# Patient Record
Sex: Male | Born: 1985 | Race: White | Hispanic: No | Marital: Married | Smoking: Current every day smoker
Health system: Southern US, Community
[De-identification: ages and names within clinical notes are randomized; demographics above are authoritative.]

## PROBLEM LIST (undated history)

## (undated) DIAGNOSIS — L732 Hidradenitis suppurativa: Secondary | ICD-10-CM

## (undated) DIAGNOSIS — F329 Major depressive disorder, single episode, unspecified: Secondary | ICD-10-CM

## (undated) DIAGNOSIS — F32A Depression, unspecified: Secondary | ICD-10-CM

---
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
## 2011-06-13 ENCOUNTER — Emergency Department (INDEPENDENT_AMBULATORY_CARE_PROVIDER_SITE_OTHER): Payer: 59

## 2011-06-13 ENCOUNTER — Emergency Department (HOSPITAL_BASED_OUTPATIENT_CLINIC_OR_DEPARTMENT_OTHER)
Admission: EM | Admit: 2011-06-13 | Discharge: 2011-06-13 | Disposition: A | Discharge status: Home / Self Care | Payer: 59 | Attending: Emergency Medicine | Admitting: Emergency Medicine

## 2011-06-13 ENCOUNTER — Encounter (HOSPITAL_BASED_OUTPATIENT_CLINIC_OR_DEPARTMENT_OTHER): Payer: Self-pay

## 2011-06-13 ENCOUNTER — Other Ambulatory Visit: Payer: Self-pay

## 2011-06-13 DIAGNOSIS — R51 Headache: Secondary | ICD-10-CM

## 2011-06-13 DIAGNOSIS — S0083XA Contusion of other part of head, initial encounter: Secondary | ICD-10-CM

## 2011-06-13 DIAGNOSIS — W19XXXA Unspecified fall, initial encounter: Secondary | ICD-10-CM | POA: Insufficient documentation

## 2011-06-13 DIAGNOSIS — E860 Dehydration: Secondary | ICD-10-CM | POA: Insufficient documentation

## 2011-06-13 DIAGNOSIS — S0003XA Contusion of scalp, initial encounter: Secondary | ICD-10-CM | POA: Insufficient documentation

## 2011-06-13 DIAGNOSIS — R197 Diarrhea, unspecified: Secondary | ICD-10-CM | POA: Insufficient documentation

## 2011-06-13 DIAGNOSIS — Y9289 Other specified places as the place of occurrence of the external cause: Secondary | ICD-10-CM | POA: Insufficient documentation

## 2011-06-13 DIAGNOSIS — S1093XA Contusion of unspecified part of neck, initial encounter: Secondary | ICD-10-CM | POA: Insufficient documentation

## 2011-06-13 DIAGNOSIS — R209 Unspecified disturbances of skin sensation: Secondary | ICD-10-CM

## 2011-06-13 DIAGNOSIS — R55 Syncope and collapse: Secondary | ICD-10-CM | POA: Insufficient documentation

## 2011-06-13 DIAGNOSIS — R1110 Vomiting, unspecified: Secondary | ICD-10-CM | POA: Insufficient documentation

## 2011-06-13 LAB — BASIC METABOLIC PANEL
Calcium: 9.5 mg/dL (ref 8.4–10.5)
GFR calc non Af Amer: >90 mL/min (ref >90)
Sodium: 139 mEq/L (ref 135–145)

## 2011-06-13 LAB — URINALYSIS, ROUTINE W REFLEX MICROSCOPIC
Ketones, ur: NEGATIVE mg/dL
Leukocytes, UA: NEGATIVE
Protein, ur: NEGATIVE mg/dL
Urobilinogen, UA: 1 mg/dL (ref 0.0–1.0)

## 2011-06-13 LAB — CBC
MCH: 30.8 pg (ref 26.0–34.0)
MCHC: 35.9 g/dL (ref 30.0–36.0)
Platelets: 344 10*3/uL (ref 150–400)
RDW: 12.9 % (ref 11.5–15.5)

## 2011-06-13 LAB — CARDIAC PANEL(CRET KIN+CKTOT+MB+TROPI): Total CK: 68 U/L (ref 7–232)

## 2011-06-13 MED ORDER — ACETAMINOPHEN 325 MG PO TABS
650.0000 mg | ORAL_TABLET | Freq: Once | ORAL | Status: AC
  Administered 2011-06-13: 650 mg via ORAL
  Filled 2011-06-13: qty 2

## 2011-06-13 MED ORDER — SODIUM CHLORIDE 0.9 % IV BOLUS (SEPSIS)
1000.0000 mL | Freq: Once | INTRAVENOUS | Status: AC
  Administered 2011-06-13: 1000 mL via INTRAVENOUS

## 2011-06-13 MED ORDER — ONDANSETRON HCL 4 MG PO TABS: 4.0000 mg | ORAL_TABLET | Freq: Four times a day (QID) | ORAL | Status: AC

## 2011-06-13 MED ORDER — IBUPROFEN 800 MG PO TABS: 800.0000 mg | ORAL_TABLET | Freq: Three times a day (TID) | ORAL | Status: AC

## 2011-06-13 NOTE — ED Notes (Signed)
Pt sts has been sick with stomach virus,started feeling sick again,went to work and pass out falling on side of left face, noted slight swelling under eye.

## 2011-06-13 NOTE — ED Provider Notes (Addendum)
History     CSN: [id number]  Arrival date & time 06/13/11  3086   None     Chief Complaint  Patient presents with  . Loss of Consciousness   26 year old male with no significant past medical history states has been sick with a "stomach virus" over the past 6-7, days. He's had nausea, vomiting, and watery diarrhea. He felt that he was getting better over the weekend, and, states he felt better yesterday. Today he went to work and became dizzy and fell over and did strike his face on the concrete. He apparently has a history of a mandible fracture from years ago. He has had no chest pain or palpitations. He denies any abdominal pain or back pain. He denies any headache or any focal weakness. No numbness, weakness or tingling. Last emesis was one to 2 days ago. Last diarrhea was yesterday and in described as watery. No recent travel or antibiotics. No new medications. No drugs, alcohol, or tobacco. His father states that he does take care of his own children on the weekends. However, his children have not been recently sick. Presently, he denies any pain. He still feels somewhat dizzy. No other symptoms at this time  (Consider location/radiation/quality/duration/timing/severity/associated sxs/prior treatment) HPI  History reviewed. No pertinent past medical history.  Past Surgical History  Procedure Date  . Mandible surgery 2005    History reviewed. No pertinent family history.  History  Substance Use Topics  . Smoking status: Not on file  . Smokeless tobacco: Not on file  . Alcohol Use: No      Review of Systems  All other systems reviewed and are negative.    Allergies  Review of patient's allergies indicates no known allergies.  Home Medications  No current outpatient prescriptions on file.  BP 131/80  Pulse 110  Temp(Src) 97.8 F (36.6 C) (Oral)  Resp 20  SpO2 97%  Physical Exam  Nursing note and vitals reviewed. Constitutional: He is oriented to person, place,  and time. He appears well-developed and well-nourished.  HENT:  Head: Normocephalic.       Mild tenderness along the bilateral mandible. There is no outward signs of contusion, redness or step-off no raccoon's eyes or battle signs.  Eyes: Conjunctivae and EOM are normal. Pupils are equal, round, and reactive to light.  Neck: Neck supple.  Cardiovascular: Normal rate and regular rhythm.  Exam reveals no gallop and no friction rub.   No murmur heard. Pulmonary/Chest: Breath sounds normal. He has no wheezes. He has no rales. He exhibits no tenderness.  Abdominal: Soft. Bowel sounds are normal. He exhibits no distension. There is no tenderness. There is no rebound and no guarding.       Abdomen is soft, nontender. Bowel sounds present appear normal  Musculoskeletal: Normal range of motion.  Neurological: He is alert and oriented to person, place, and time. No cranial nerve deficit. Coordination normal.  Skin: Skin is warm and dry. No rash noted.  Psychiatric: He has a normal mood and affect.    ED Course  Procedures (including critical care time)  Labs Reviewed - No data to display No results found.   No diagnosis found.    MDM  Pt is seen and examined;  Initial history and physical completed.  Will follow.     Will check EKG, labs and hydrate. CT scan of the maxillofacial has been ordered at the request of the family. Vital signs are stable. Likely secondary to dehydration and orthostasis.  Kathleen Tamm A. Patrica Duel, MD 06/13/11 0724   Date: 06/13/2011  Rate: 88  Rhythm: normal sinus rhythm  QRS Axis: normal  Intervals: normal  ST/T Wave abnormalities: normal  Conduction Disutrbances:none  Narrative Interpretation:   Old EKG Reviewed: none available    Results for orders placed during the hospital encounter of 06/13/11  CBC      Component Value Range   WBC 6.2  4.0 - 10.5 (K/uL)   RBC 5.22  4.22 - 5.81 (MIL/uL)   Hemoglobin 16.1  13.0 - 17.0 (g/dL)   HCT 14.7  82.9 -  56.2 (%)   MCV 86.0  78.0 - 100.0 (fL)   MCH 30.8  26.0 - 34.0 (pg)   MCHC 35.9  30.0 - 36.0 (g/dL)   RDW 13.0  86.5 - 78.4 (%)   Platelets 344  150 - 400 (K/uL)   Ct Maxillofacial Wo Cm  06/13/2011  *RADIOLOGY REPORT*  Clinical Data: Fall with facial pain and numbness.  CT MAXILLOFACIAL WITHOUT CONTRAST  Technique:  Multidetector CT imaging of the maxillofacial structures was performed. Multiplanar CT image reconstructions were also generated.  Comparison: None.  Findings: There are fracture deformities of the superior, anterior and lateral walls of the left maxillary sinus.  No corresponding air-fluid level in the left maxillary sinus.  Minimal mucosal thickening is seen inferiorly.  No additional evidence of fracture. Plate and screw fixation of the right mandible is noted.  Visualized portions of the intracranial contents show no acute findings.  Soft tissues are unremarkable.  IMPRESSION:  1. Fracture deformities of the superior, anterior and lateral walls of the left maxillary sinus appear remote, given the absence of fluid in the left maxillary sinus.  Please correlate clinically. 2.  No additional evidence of acute fracture.  Original Report Authenticated By: Reyes Ivan, M.D.    8:33 AM  Patient resting comfortably, initial labs are, reassuring. We'll continue to hydrate and follow   9:17 AM  Patient feels better. Urine showed no ketones. Electrolytes were normal. CBC was normal. CK and troponin were normal. CT of the maxillofacial showed the fractures only. No evidence of any acute fracture Discussed the medication with patient recommended Tylenol and Motrin   He would like to go home and rest, which is reasonable. He is totally dependent fluids and rest and to follow up with a primary medical doctor or to return to ED for any concerns   Theron Arista A. Patrica Duel, MD 06/13/11 [phone]

## 2011-06-13 NOTE — Discharge Instructions (Signed)
Contusion A contusion is a deep bruise. Bruises happen when an injury causes bleeding under the skin. Signs of bruising include pain, puffiness (swelling), and discolored skin. The bruise may turn blue, purple, or yellow. HOME CARE   Rest the injured area until the pain and puffiness are better.   Try to limit use of the injured area as much as possible or as told by your doctor.   Put ice on the injured area.   Put ice in a plastic bag.   Place a towel between your skin and the bag.   Leave the ice on for 15 to 20 minutes, 3 to 4 times a day.   Raise (elevate) the injured area above the level of the heart.   Use an elastic bandage to lessen puffiness and motion.   Only take medicine as told by your doctor.   Eat healthy.   See your doctor for a follow-up visit.  GET HELP RIGHT AWAY IF:   There is more redness, puffiness, or pain.   You have a headache, muscle ache, or you feel dizzy and ill.   You have a fever.   The pain is not controlled with medicine.   The bruise is not getting better.   There is yellowish white fluid (pus) coming from the wound.   You lose feeling (numbness) in the injured area.   The bruised area feels cold.   There are new problems.  MAKE SURE YOU:   Understand these instructions.   Will watch your condition.   Will get help right away if you are not doing well or get worse.  Document Released: 10/03/2007 Document Revised: 12/27/2010 Document Reviewed: 10/03/2007 [facility] Patient Information 2012 [place], [place].  Syncope You have had a fainting (syncopal) spell. A fainting episode is a sudden, short-lived loss of consciousness. It results in complete recovery. It occurs because there has been a temporary shortage of oxygen and/or sugar (glucose) to the brain. CAUSES   Blood pressure pills and other medications that may lower blood pressure below normal. Sudden changes in posture (sudden standing).   Over-medication. Take your  medications as directed.   Standing too long. This can cause blood to pool in the legs.   Seizure disorders.   Low blood sugar (hypoglycemia) of diabetes. This more commonly causes coma.   Bearing down to go to the bathroom. This can cause your blood pressure to rise suddenly. Your body compensates by making the blood pressure too low when you stop bearing down.   Hardening of the arteries where the brain temporarily does not receive enough blood.   Irregular heart beat and circulatory problems.   Fear, emotional distress, injury, sight of blood, or illness.  Your caregiver will send you home if the syncope was from non-worrisome causes (benign). Depending on your age and health, you may stay to be monitored and observed. If you return home, have someone stay with you if your caregiver feels that is desirable. It is very important to keep all follow-up referrals and appointments in order to properly manage this condition. This is a serious problem which can lead to serious illness and death if not carefully managed.  WARNING: Do not drive or operate machinery until your caregiver feels that it is safe for you to do so. SEEK IMMEDIATE MEDICAL CARE IF:   You have another fainting episode or faint while lying or sitting down. DO NOT DRIVE YOURSELF. Call 911 if no other help is available.   You  have chest pain, are feeling sick to your stomach (nausea), vomiting or abdominal pain.   You have an irregular heartbeat or one that is very fast (pulse over 120 beats per minute).   You have a loss of feeling in some part of your body or lose movement in your arms or legs.   You have difficulty with speech, confusion, severe weakness, or visual problems.   You become sweaty and/or feel light headed.  Make sure you are rechecked as instructed. Document Released: 04/16/2005 Document Revised: 12/27/2010 Document Reviewed: 12/05/2006 [facility] Patient Information 2012 [place], [place].

## 2013-04-09 ENCOUNTER — Encounter (HOSPITAL_BASED_OUTPATIENT_CLINIC_OR_DEPARTMENT_OTHER): Payer: Self-pay | Admitting: Emergency Medicine

## 2013-04-09 ENCOUNTER — Emergency Department (HOSPITAL_BASED_OUTPATIENT_CLINIC_OR_DEPARTMENT_OTHER): Payer: 59

## 2013-04-09 ENCOUNTER — Emergency Department (HOSPITAL_BASED_OUTPATIENT_CLINIC_OR_DEPARTMENT_OTHER)
Admission: EM | Admit: 2013-04-09 | Discharge: 2013-04-09 | Disposition: A | Discharge status: Home / Self Care | Payer: 59 | Attending: Emergency Medicine | Admitting: Emergency Medicine

## 2013-04-09 DIAGNOSIS — M25579 Pain in unspecified ankle and joints of unspecified foot: Secondary | ICD-10-CM | POA: Insufficient documentation

## 2013-04-09 DIAGNOSIS — F17200 Nicotine dependence, unspecified, uncomplicated: Secondary | ICD-10-CM | POA: Insufficient documentation

## 2013-04-09 DIAGNOSIS — M79671 Pain in right foot: Secondary | ICD-10-CM | POA: Diagnosis present

## 2013-04-09 NOTE — ED Provider Notes (Signed)
CSN: [id number]     Arrival date & time 04/09/13  1408 History   First MD Initiated Contact with Patient 04/09/13 1413     Chief Complaint  Patient presents with  . Foot Pain   (Consider location/radiation/quality/duration/timing/severity/associated sxs/prior Treatment) Patient is a 27 y.o. male presenting with lower extremity pain. The history is provided by the patient.  Foot Pain This is a new problem. The current episode started 1 to 2 hours ago. The problem occurs constantly. The problem has not changed since onset.Pertinent negatives include no chest pain, no abdominal pain, no headaches and no shortness of breath. The symptoms are aggravated by walking. Nothing relieves the symptoms. He has tried nothing for the symptoms. The treatment provided no relief.    History reviewed. No pertinent past medical history. Past Surgical History  Procedure Laterality Date  . Mandible surgery  2005   No family history on file. History  Substance Use Topics  . Smoking status: Current Every Day Smoker -- 5 years  . Smokeless tobacco: Not on file  . Alcohol Use: No    Review of Systems  Constitutional: Negative for fever.  HENT: Negative for drooling and rhinorrhea.   Eyes: Negative for pain.  Respiratory: Negative for cough and shortness of breath.   Cardiovascular: Negative for chest pain and leg swelling.  Gastrointestinal: Negative for nausea, vomiting, abdominal pain and diarrhea.  Genitourinary: Negative for dysuria and hematuria.  Musculoskeletal: Negative for gait problem and neck pain.  Skin: Negative for color change.  Neurological: Negative for numbness and headaches.  Hematological: Negative for adenopathy.  Psychiatric/Behavioral: Negative for behavioral problems.  All other systems reviewed and are negative.    Allergies  Review of patient's allergies indicates no known allergies.  Home Medications  No current outpatient prescriptions on file. BP 154/86  Pulse 100   Temp(Src) 97.6 F (36.4 C) (Oral)  Resp 18  Ht 5\' 9"  (1.753 m)  Wt 280 lb (127.007 kg)  BMI 41.33 kg/m2  SpO2 99% Physical Exam  Nursing note and vitals reviewed. Constitutional: He is oriented to person, place, and time. He appears well-developed and well-nourished.  HENT:  Head: Normocephalic and atraumatic.  Right Ear: External ear normal.  Left Ear: External ear normal.  Nose: Nose normal.  Mouth/Throat: Oropharynx is clear and moist. No oropharyngeal exudate.  Eyes: Conjunctivae and EOM are normal. Pupils are equal, round, and reactive to light.  Neck: Normal range of motion. Neck supple.  Cardiovascular: Normal rate, regular rhythm, normal heart sounds and intact distal pulses.  Exam reveals no gallop and no friction rub.   No murmur heard. Pulmonary/Chest: Effort normal and breath sounds normal. No respiratory distress. He has no wheezes.  Abdominal: Soft. Bowel sounds are normal. He exhibits no distension. There is no tenderness. There is no rebound and no guarding.  Musculoskeletal: Normal range of motion. He exhibits tenderness (mild tenderness to palpation of the mid right foot on the dorsal aspect. 2+ distal pulses and sensation intact diffusely. Normal range of motion of the right foot and right ankle.). He exhibits no edema.  Neurological: He is alert and oriented to person, place, and time.  Skin: Skin is warm and dry.  Psychiatric: He has a normal mood and affect. His behavior is normal.    ED Course  Procedures (including critical care time) Labs Review Labs Reviewed - No data to display Imaging Review Dg Foot Complete Right  04/09/2013   CLINICAL DATA:  Pain  EXAM: RIGHT FOOT COMPLETE -  3+ VIEW  COMPARISON:  None.  FINDINGS: Frontal, oblique, and lateral views were obtained. There is no fracture or dislocation. Joint spaces appear intact. There are minimal inferior and posterior calcaneal spurs.  IMPRESSION: Minimal calcaneal spurs.  No fracture or appreciable  arthropathy.   Electronically Signed   By: Bretta Bang M.D.   On: 04/09/2013 14:35    EKG Interpretation   None       MDM   1. Right foot pain    2:17 PM 27 y.o. male who presents with sudden onset right mid foot pain which occurred while walking approximately 2 hours ago. He denies injury. He states that he only has pain when placing weight on the foot. He is neurovascularly intact and appears well on exam. He refuses pain medicine at this time. Will get screening imaging.  2:50 PM: No injury noted on imaging. Will provide crutches. Possibly sprained. I have discussed the diagnosis/risks/treatment options with the patient and believe the pt to be eligible for discharge home to follow-up with pcp or return here in 7 days if pain continues. We also discussed returning to the ED immediately if new or worsening sx occur. We discussed the sx which are most concerning (e.g., worsening pain) that necessitate immediate return. Any new prescriptions provided to the patient are listed below.  New Prescriptions   No medications on file     Junius Argyle, MD 04/09/13 1450

## 2013-04-09 NOTE — ED Notes (Signed)
Pt states walking at work and top of right foot starting hurting. Denies injury

## 2013-04-09 NOTE — ED Notes (Signed)
Patient transported to & from X-ray. 

## 2016-05-10 ENCOUNTER — Encounter (HOSPITAL_BASED_OUTPATIENT_CLINIC_OR_DEPARTMENT_OTHER): Payer: Self-pay | Admitting: Emergency Medicine

## 2016-05-10 ENCOUNTER — Emergency Department (HOSPITAL_BASED_OUTPATIENT_CLINIC_OR_DEPARTMENT_OTHER)
Admission: EM | Admit: 2016-05-10 | Discharge: 2016-05-10 | Disposition: A | Discharge status: Home / Self Care | Payer: 59 | Attending: Emergency Medicine | Admitting: Emergency Medicine

## 2016-05-10 DIAGNOSIS — F17200 Nicotine dependence, unspecified, uncomplicated: Secondary | ICD-10-CM | POA: Insufficient documentation

## 2016-05-10 DIAGNOSIS — Z79899 Other long term (current) drug therapy: Secondary | ICD-10-CM | POA: Insufficient documentation

## 2016-05-10 DIAGNOSIS — H9202 Otalgia, left ear: Secondary | ICD-10-CM

## 2016-05-10 HISTORY — DX: Major depressive disorder, single episode, unspecified: F32.9

## 2016-05-10 HISTORY — DX: Depression, unspecified: F32.A

## 2016-05-10 NOTE — ED Triage Notes (Signed)
Pt reports intense L ear pain following eating lunch today. Pain has subsided some.

## 2016-05-10 NOTE — Discharge Instructions (Signed)
You ear looks normal today. No signs of infections at this time. This is likely issue with your jaw possibly from history of dislocation. I would continue to use tylenol and ibuprofen for pain. Use a heating pack on your left side of face. Follow up with your primary care doctor and orthopaedics. I am also giving you a referral to ENT. Return to the Ed if you develop and discharge from your ear, hearing loss, worsening pain, numbness/tingling, or fevers.

## 2016-05-11 NOTE — ED Provider Notes (Signed)
[facility] Provider Note   CSN: [id number] Arrival date & time: 05/10/16  1548     History   Chief Complaint Chief Complaint  Patient presents with  . Otalgia    HPI Christopher Hayes is a 31 y.o. male.  31 year old Caucasian male with no significant past medical history presents to the ED today with complaint of left ear pain while eating lunch. Patient took Tylenol and his pain has subsided since arriving to the ED. Patient states that he does have a history of mandible dislocation in 2005. He has been having this pain intermittently for the past 4-5 months. He denies any discharge from his ear. He denies any hearing loss. Opening and closing his mouth at times makes the pain worse. He describes the pain as sharp in nature. Denies any pain at this time. Denies any fever, chills, headache, vision changes, mastoid tenderness, rhinorrhea, sore throat, cough or any other associated symptoms.      Past Medical History:  Diagnosis Date  . Depression     Patient Active Problem List   Diagnosis Date Noted  . Right foot pain 04/09/2013    Past Surgical History:  Procedure Laterality Date  . MANDIBLE SURGERY  2005       Home Medications    Prior to Admission medications   Medication Sig Start Date End Date Taking? Authorizing Provider  buPROPion (WELLBUTRIN) 75 MG tablet Take 75 mg by mouth 2 (two) times daily.   Yes Historical Provider, MD    Family History No family history on file.  Social History Social History  Substance Use Topics  . Smoking status: Current Every Day Smoker    Years: 5.00  . Smokeless tobacco: Never Used  . Alcohol use No     Allergies   Patient has no known allergies.   Review of Systems Review of Systems  Constitutional: Negative for chills and fever.  HENT: Positive for ear pain. Negative for congestion, ear discharge, postnasal drip, rhinorrhea, sinus pain, sinus pressure and sore throat.   Eyes: Negative for  visual disturbance.  Neurological: Negative for headaches.  All other systems reviewed and are negative.    Physical Exam Updated Vital Signs BP 132/82 (BP Location: Right Arm)   Pulse 78   Temp 98.5 F (36.9 C) (Oral)   Resp 16   Wt 105.2 kg   SpO2 100%   BMI 34.26 kg/m   Physical Exam  Constitutional: He appears well-developed and well-nourished. No distress.  HENT:  Head: Normocephalic and atraumatic.  Right Ear: Tympanic membrane, external ear and ear canal normal. No tenderness. No mastoid tenderness. Tympanic membrane is not perforated.  Left Ear: Tympanic membrane, external ear and ear canal normal. No tenderness. No mastoid tenderness. Tympanic membrane is not perforated.  Nose: Nose normal.  Mouth/Throat: Uvula is midline, oropharynx is clear and moist and mucous membranes are normal.  Patient with mild pain to palpation over the left TM joint. No erythema, edema, ecchymosis. No visualized gross abscess in the mouth. No pain with palpation of the dentition. Left ear appears normal.  Eyes: Conjunctivae are normal. Pupils are equal, round, and reactive to light. Right eye exhibits no discharge. Left eye exhibits no discharge. No scleral icterus.  Neck: Normal range of motion. Neck supple.  Pulmonary/Chest: No respiratory distress.  Musculoskeletal: Normal range of motion.  Lymphadenopathy:    He has no cervical adenopathy.  Neurological: He is alert.  Skin: Skin is warm and dry. Capillary refill takes less  than 2 seconds. No pallor.  Nursing note and vitals reviewed.    ED Treatments / Results  Labs (all labs ordered are listed, but only abnormal results are displayed) Labs Reviewed - No data to display  EKG  EKG Interpretation None       Radiology No results found.  Procedures Procedures (including critical care time)  Medications Ordered in ED Medications - No data to display   Initial Impression / Assessment and Plan / ED Course  I have reviewed  the triage vital signs and the nursing notes.  Pertinent labs & imaging results that were available during my care of the patient were reviewed by me and considered in my medical decision making (see chart for details).  Clinical Course   Patient presents to the ED with left ear pain while eating lunch today. Pain has subsided prior to arrival. Patient with history of mandibular dislocation. This pain has been ongoing for the past 4-5 months intermittently. Patient states pain is worse with jaw movement. Ear is normal. No signs of perforated TM. No signs of otitis media or otitis externa. Hearing is normal. No discharge. No erythema, edema of the face. No gross abscess noted in the mouth. 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 Encourage patient to continue to use ibuprofen and Tylenol for pain. Warm compresses to the left side of the face. Encouraged to follow-up with his primary care doctor and given a referral orthopedist and ENT. Patient does not appear to be any acute distress. He was hemodynamically stable. He is afebrile and not tachycardic. Pt is hemodynamically stable, in NAD, & able to ambulate in the ED. Pain has been managed & has no complaints prior to dc. Pt is comfortable with above plan and is stable for discharge at this time. All questions were answered prior to disposition. Strict return precautions for f/u to the ED were discussed.   Final Clinical Impressions(s) / ED Diagnoses   Final diagnoses:  Otalgia of left ear    New Prescriptions Discharge Medication List as of 05/10/2016  4:29 PM       Rise MuKenneth T Leaphart, PA-C 05/11/16 0120    Jacalyn LefevreJulie Haviland, MD 05/15/16 [phone]

## 2018-09-13 ENCOUNTER — Emergency Department (HOSPITAL_BASED_OUTPATIENT_CLINIC_OR_DEPARTMENT_OTHER)
Admission: EM | Admit: 2018-09-13 | Discharge: 2018-09-13 | Disposition: A | Discharge status: Home / Self Care | Payer: 59 | Attending: Emergency Medicine | Admitting: Emergency Medicine

## 2018-09-13 ENCOUNTER — Encounter (HOSPITAL_BASED_OUTPATIENT_CLINIC_OR_DEPARTMENT_OTHER): Payer: Self-pay | Admitting: Emergency Medicine

## 2018-09-13 ENCOUNTER — Other Ambulatory Visit: Payer: Self-pay

## 2018-09-13 DIAGNOSIS — R1903 Right lower quadrant abdominal swelling, mass and lump: Secondary | ICD-10-CM | POA: Diagnosis present

## 2018-09-13 DIAGNOSIS — L02211 Cutaneous abscess of abdominal wall: Secondary | ICD-10-CM | POA: Diagnosis not present

## 2018-09-13 DIAGNOSIS — F17210 Nicotine dependence, cigarettes, uncomplicated: Secondary | ICD-10-CM | POA: Insufficient documentation

## 2018-09-13 DIAGNOSIS — L0291 Cutaneous abscess, unspecified: Secondary | ICD-10-CM

## 2018-09-13 DIAGNOSIS — Z79899 Other long term (current) drug therapy: Secondary | ICD-10-CM | POA: Diagnosis not present

## 2018-09-13 MED ORDER — KETOROLAC TROMETHAMINE 60 MG/2ML IM SOLN
60.0000 mg | Freq: Once | INTRAMUSCULAR | Status: AC
  Administered 2018-09-13: 60 mg via INTRAMUSCULAR
  Filled 2018-09-13: qty 2

## 2018-09-13 MED ORDER — LIDOCAINE HCL (PF) 1 % IJ SOLN
10.0000 mL | Freq: Once | INTRAMUSCULAR | Status: AC
  Administered 2018-09-13: 10 mL via INTRADERMAL
  Filled 2018-09-13: qty 10

## 2018-09-13 NOTE — ED Triage Notes (Addendum)
Abscess to abdomen. Started abx by UC yesterday

## 2018-09-13 NOTE — ED Notes (Signed)
Elson Clan, PA at bedside for I&D

## 2018-09-13 NOTE — Discharge Instructions (Signed)
Apply warm compresses to the area or soak the area in warm water to help continue express drainage.   Keep the wound clean and dry. Gently wash the wound with soap and water and make sure to pat it dry.   Continue to take antibiotic and complete the entire course.   You can take Tylenol or Ibuprofen as directed for pain.  Return to the Emergency Department if you experienced any worsening/spreading redness or swelling, fever, worsening pain, or any other worsening or concerning symptoms.

## 2018-09-13 NOTE — ED Provider Notes (Signed)
[facility] EMERGENCY DEPARTMENT Provider Note   CSN: [id number] Arrival date & time: 09/13/18  1630    History   Chief Complaint Chief Complaint  Patient presents with  . Abscess    HPI Christopher Hayes is a 33 y.o. male who presents for evaluation of pain, redness and swelling noted to his right lower abdomen x2 days.  Patient states that since then, the area has become larger, more swollen, more red.  He went to urgent care yesterday for evaluation of symptoms.  At that time, is not amenable to I&D.  He was given Bactrim to take.  He reports he has been taking 3 doses.  Comes in today because he states he is continuing to have pain.  Patient states he has not taken any medication for the pain.  Patient denies any fevers, nausea/vomiting, abdominal pain.     The history is provided by the patient.    Past Medical History:  Diagnosis Date  . Depression     Patient Active Problem List   Diagnosis Date Noted  . Right foot pain 04/09/2013    Past Surgical History:  Procedure Laterality Date  . MANDIBLE SURGERY  2005        Home Medications    Prior to Admission medications   Medication Sig Start Date End Date Taking? Authorizing Provider  buPROPion (WELLBUTRIN) 75 MG tablet Take 75 mg by mouth 2 (two) times daily.   Yes [provider]    Family History No family history on file.  Social History Social History   Tobacco Use  . Smoking status: Current Every Day Smoker    Years: 5.00  . Smokeless tobacco: Never Used  Substance Use Topics  . Alcohol use: No  . Drug use: No     Allergies   Patient has no known allergies.   Review of Systems Review of Systems  Constitutional: Negative for fever.  Gastrointestinal: Negative for nausea and vomiting.  Skin: Positive for color change and wound.  All other systems reviewed and are negative.    Physical Exam Updated Vital Signs BP 140/83 (BP Location: Right Arm)   Pulse 89   Temp  98.2 F (36.8 C) (Oral)   Resp 16   Ht  (1.753 m)   Wt 127 kg   SpO2 97%   BMI 41.35 kg/m   Physical Exam Vitals signs and nursing note reviewed.  Constitutional:      Appearance: He is well-developed.  HENT:     Head: Normocephalic and atraumatic.  Eyes:     General: No scleral icterus.       Right eye: No discharge.        Left eye: No discharge.     Conjunctiva/sclera: Conjunctivae normal.  Pulmonary:     Effort: Pulmonary effort is normal.  Abdominal:     General: There is no distension.     Tenderness: There is no abdominal tenderness.       Comments: Patient with a 5 x 6 area of erythema, warmth with about a 3 cm central area of induration.  Within that central area of induration, there is a very small perhaps 1 cm area of fluctuance.  Soft, nondistended.  Skin:    General: Skin is warm and dry.  Neurological:     Mental Status: He is alert.  Psychiatric:        Speech: Speech normal.        Behavior: Behavior normal.  ED Treatments / Results  Labs (all labs ordered are listed, but only abnormal results are displayed) Labs Reviewed - No data to display  EKG None  Radiology No results found.  Procedures .Marland Kitchen.Incision and Drainage Date/Time: 09/13/2018 8:52 PM Performed by: Maxwell CaulLayden, Lindsey A, PA-C Authorized by: Maxwell CaulLayden, Lindsey A, PA-C   Consent:    Consent obtained:  Verbal   Consent given by:  Patient   Risks discussed:  Bleeding, incomplete drainage, pain and damage to other organs   Alternatives discussed:  No treatment Universal protocol:    Procedure explained and questions answered to patient or proxy's satisfaction: yes     Relevant documents present and verified: yes     Test results available and properly labeled: yes     Imaging studies available: yes     Required blood products, implants, devices, and special equipment available: yes     Site/side marked: yes     Immediately prior to procedure a time out was called:  yes     Patient identity confirmed:  Verbally with patient Location:    Type:  Abscess   Location:  Trunk   Trunk location:  Abdomen Pre-procedure details:    Skin preparation:  Betadine Anesthesia (see MAR for exact dosages):    Anesthesia method:  Local infiltration   Local anesthetic:  Lidocaine 1% w/o epi Procedure type:    Complexity:  Complex Procedure details:    Incision types:  Single straight   Incision depth:  Subcutaneous   Scalpel blade:  11   Wound management:  Probed and deloculated, irrigated with saline and extensive cleaning   Drainage:  Purulent   Drainage amount:  Moderate   Wound treatment:  Wound left open Post-procedure details:    Patient tolerance of procedure:  Tolerated well, no immediate complications Comments:     Once the wound was anesthetized, it was thoroughly extensively cleaned and irrigated.  Small incision was made which revealed moderate amount of purulent drainage.  On evaluation of the wound, it appeared very superficial not deep at all which correlated with ultrasound evaluation.  The abscess was not deep enough to require a drain.  The surrounding cellulitis was marked with skin marker and the area was bandaged.   (including critical care time)  Medications Ordered in ED Medications  ketorolac (TORADOL) injection 60 mg (60 mg Intramuscular Given 09/13/18 1919)  lidocaine (PF) (XYLOCAINE) 1 % injection 10 mL (10 mLs Intradermal Given by Other 09/13/18 1921)     Initial Impression / Assessment and Plan / ED Course  I have reviewed the triage vital signs and the nursing notes.  Pertinent labs & imaging results that were available during my care of the patient were reviewed by me and considered in my medical decision making (see chart for details).        33 year old male who presents for evaluation of redness, warmth, and pain noted to right lower quadrant of abdomen x2 days.  Patient states he has not had any fevers, nausea/vomiting.   Seen in urgent care yesterday and was treated for abscess.  It was not amenable to I&D at that time.  He is on Bactrim which he has taken 3 times doses. Patient is afebrile, non-toxic appearing, sitting comfortably on examination table. Vital signs reviewed and stable.  On exam, he has an area of warmth, erythema, induration with a very small area of central fluctuance.  Bedside ultrasound applied to the area.  There does appear to be a very  small superficial fluid collection that extends about no more than 1 cm in depth.  Given no signs of systemic illness as well as reassuring abdominal exam, do not suspect that this is a deep abscess that would require further imaging with CT.  I discussed with patient regarding treatment options.  I did discuss that the pain is most likely still occurring because he has not taken any pain medications as well as the area the fold of his abdomen which most likely causing friction.  Offered to continue having him take antibiotics and pain medication versus attempting I&D here in the department.  Patient wishes to try I&D to see if there is anything to express.  I&D as documented above.  Patient tolerated procedure well.  The surrounding cellulitis was marked.  Given that he is only been on antibiotics for 24 hours, do not feel that this represents a failure of outpatient therapy.  Feel that this is mostly related to not having an I&D.  Instructed patient to continue taking antibiotics. At this time, patient exhibits no emergent life-threatening condition that require further evaluation in ED or admission. Patient had ample opportunity for questions and discussion. All patient's questions were answered with full understanding. Strict return precautions discussed. Patient expresses understanding and agreement to plan.   Portions of this note were generated with Scientist, clinical (histocompatibility and immunogenetics). Dictation errors may occur despite best attempts at proofreading.   Final Clinical  Impressions(s) / ED Diagnoses   Final diagnoses:  Abscess    ED Discharge Orders    None       Rosana Hoes 09/13/18 2308    Tilden Fossa, MD 09/13/18 2317

## 2018-09-13 NOTE — ED Notes (Signed)
Pt ambulated to d/c window with steady gait 

## 2019-04-11 ENCOUNTER — Emergency Department (HOSPITAL_BASED_OUTPATIENT_CLINIC_OR_DEPARTMENT_OTHER): Payer: 59

## 2019-04-11 ENCOUNTER — Encounter (HOSPITAL_BASED_OUTPATIENT_CLINIC_OR_DEPARTMENT_OTHER): Payer: Self-pay | Admitting: Emergency Medicine

## 2019-04-11 ENCOUNTER — Other Ambulatory Visit: Payer: Self-pay

## 2019-04-11 ENCOUNTER — Emergency Department (HOSPITAL_BASED_OUTPATIENT_CLINIC_OR_DEPARTMENT_OTHER)
Admission: EM | Admit: 2019-04-11 | Discharge: 2019-04-11 | Disposition: A | Discharge status: Home / Self Care | Payer: 59 | Attending: Emergency Medicine | Admitting: Emergency Medicine

## 2019-04-11 DIAGNOSIS — X509XXA Other and unspecified overexertion or strenuous movements or postures, initial encounter: Secondary | ICD-10-CM | POA: Diagnosis not present

## 2019-04-11 DIAGNOSIS — Y999 Unspecified external cause status: Secondary | ICD-10-CM | POA: Insufficient documentation

## 2019-04-11 DIAGNOSIS — Y92008 Other place in unspecified non-institutional (private) residence as the place of occurrence of the external cause: Secondary | ICD-10-CM | POA: Diagnosis not present

## 2019-04-11 DIAGNOSIS — Y9389 Activity, other specified: Secondary | ICD-10-CM | POA: Insufficient documentation

## 2019-04-11 DIAGNOSIS — F17210 Nicotine dependence, cigarettes, uncomplicated: Secondary | ICD-10-CM | POA: Insufficient documentation

## 2019-04-11 DIAGNOSIS — S83411A Sprain of medial collateral ligament of right knee, initial encounter: Secondary | ICD-10-CM | POA: Insufficient documentation

## 2019-04-11 DIAGNOSIS — S8991XA Unspecified injury of right lower leg, initial encounter: Secondary | ICD-10-CM | POA: Diagnosis present

## 2019-04-11 MED ORDER — BACITRACIN ZINC 500 UNIT/GM EX OINT: TOPICAL_OINTMENT | Freq: Two times a day (BID) | CUTANEOUS | Status: DC

## 2019-04-11 NOTE — ED Provider Notes (Signed)
[facility] EMERGENCY DEPARTMENT Provider Note   CSN: [id number] Arrival date & time: 04/11/19  1237     History No chief complaint on file.   Christopher Hayes is a 33 y.o. male.  33 year old male presents with complaint of right knee pain.  Patient states that he was coming down steps outside his house when he rolled his right ankle and twisted his right knee.  Patient denies any injury to his right ankle, states he has pain along the medial knee.  Also reports abrasion to his left knee.  No other injuries, complaints, concerns.        Past Medical History:  Diagnosis Date  . Depression     Patient Active Problem List   Diagnosis Date Noted  . Right foot pain 04/09/2013    Past Surgical History:  Procedure Laterality Date  . MANDIBLE SURGERY  2005       History reviewed. No pertinent family history.  Social History   Tobacco Use  . Smoking status: Current Every Day Smoker    Years: 5.00    Types: Cigarettes  . Smokeless tobacco: Never Used  Substance Use Topics  . Alcohol use: No  . Drug use: No    Home Medications Prior to Admission medications   Medication Sig Start Date End Date Taking? Authorizing Provider  buPROPion (WELLBUTRIN) 75 MG tablet Take 75 mg by mouth 2 (two) times daily.    [provider]    Allergies    Patient has no known allergies.  Review of Systems   Review of Systems  Constitutional: Negative for fever.  Musculoskeletal: Positive for arthralgias, joint swelling and myalgias.  Skin: Positive for wound. Negative for color change and rash.  Allergic/Immunologic: Negative for immunocompromised state.  Neurological: Negative for weakness and numbness.  Hematological: Does not bruise/bleed easily.  Psychiatric/Behavioral: Negative for self-injury.  All other systems reviewed and are negative.   Physical Exam Updated Vital Signs BP (!) 129/92 (BP Location: Right Arm)   Pulse (!) 104   Temp 98.9 F  (37.2 C) (Oral)   Resp 18   Ht 5\' 9"  (1.753 m)   Wt 122.5 kg   SpO2 96%   BMI 39.87 kg/m   Physical Exam Vitals and nursing note reviewed.  Constitutional:      General: He is not in acute distress.    Appearance: He is well-developed. He is not diaphoretic.  HENT:     Head: Normocephalic and atraumatic.  Cardiovascular:     Pulses: Normal pulses.  Pulmonary:     Effort: Pulmonary effort is normal.  Musculoskeletal:        General: Tenderness present. No swelling or deformity.     Right knee: Effusion present. No bony tenderness. Tenderness present over the MCL. No LCL or patellar tendon tenderness. No LCL laxity or MCL laxity.     Right ankle: Normal. No swelling, deformity or ecchymosis. No tenderness. Normal range of motion.       Legs:  Skin:    General: Skin is warm and dry.     Findings: No erythema or rash.  Neurological:     Mental Status: He is alert and oriented to person, place, and time.  Psychiatric:        Behavior: Behavior normal.     ED Results / Procedures / Treatments   Labs (all labs ordered are listed, but only abnormal results are displayed) Labs Reviewed - No data to display  EKG None  Radiology  DG Knee Complete 4 Views Right  Result Date: 04/11/2019 CLINICAL DATA:  Right knee pain after fall, twisting type injury with pain and swelling, pt states that the inside of his knee feels tight, able to bear weight, pain increases with medial rotation EXAM: RIGHT KNEE - COMPLETE 4+ VIEW COMPARISON:  None. FINDINGS: No fracture or bone. Joint normally spaced and aligned.  No arthropathic changes. Small to moderate-sized joint effusion in the suprapatellar joint capsule. Soft tissues otherwise unremarkable. IMPRESSION: 1. No fracture or bone lesion.  No arthropathic changes. 2. Joint effusion. Electronically Signed   By: Lajean Manes M.D.   On: 04/11/2019 13:25    Procedures Procedures (including critical care time)  Medications Ordered in  ED Medications  bacitracin ointment (has no administration in time range)    ED Course  I have reviewed the triage vital signs and the nursing notes.  Pertinent labs & imaging results that were available during my care of the patient were reviewed by me and considered in my medical decision making (see chart for details).  Clinical Course as of Apr 10 1336  Sat Apr 10, 1157  834 33 year old male with right knee pain after twisting knee while falling down the steps today.  On exam has tenderness in the medial knee with pain with valgus stress.  Small effusion present.  X-ray shows effusion, no bony abnormality.  Patient be placed in a knee sleeve and given crutches, recommend ice, elevate, Motrin, Tylenol and follow-up with sports medicine if not improving after 1 week.  Suspect likely MCL strain.   [LM]    Clinical Course User Index [LM] Roque Lias   MDM Rules/Calculators/A&P      Final Clinical Impression(s) / ED Diagnoses Final diagnoses:  Sprain of medial collateral ligament of right knee, initial encounter    Rx / DC Orders ED Discharge Orders    None       Tacy Learn, PA-C 04/11/19 1337    Davonna Belling, MD 04/11/19 1520

## 2019-04-11 NOTE — ED Triage Notes (Signed)
Pt reports a fall down stairs 1.5 hr pta, and reports "pop" in right knee. Swelling apparent. Pt denies loc, denies hitting head.

## 2019-04-11 NOTE — Discharge Instructions (Signed)
Use crutches, weight-bear as tolerated.  Knee sleeve will provide gentle compression which may help with pain.  Apply ice and elevate for 30 minutes at least three times daily.  Take Motrin and Tylenol as needed as directed. Follow-up with orthopedics after 1 week if symptoms are not improving.

## 2019-04-21 ENCOUNTER — Encounter: Payer: Self-pay | Admitting: Family Medicine

## 2019-04-21 ENCOUNTER — Ambulatory Visit: Payer: Self-pay

## 2019-04-21 ENCOUNTER — Other Ambulatory Visit: Payer: Self-pay

## 2019-04-21 ENCOUNTER — Ambulatory Visit (INDEPENDENT_AMBULATORY_CARE_PROVIDER_SITE_OTHER): Payer: 59 | Admitting: Family Medicine

## 2019-04-21 VITALS — Ht 69.0 in | Wt 270.0 lb

## 2019-04-21 DIAGNOSIS — S83241A Other tear of medial meniscus, current injury, right knee, initial encounter: Secondary | ICD-10-CM

## 2019-04-21 DIAGNOSIS — M25561 Pain in right knee: Secondary | ICD-10-CM

## 2019-04-21 DIAGNOSIS — S83004D Unspecified dislocation of right patella, subsequent encounter: Secondary | ICD-10-CM | POA: Insufficient documentation

## 2019-04-21 NOTE — Patient Instructions (Signed)
Nice to meet you Please try ice  Please wear the brace for stability  Please try the exercises   Please send me a message in MyChart with any questions or updates.  We will schedule a virtual visit when the MRI is resulted.   --Dr. Raeford Razor

## 2019-04-21 NOTE — Progress Notes (Signed)
Christopher Hayes - 33 y.o. male MRN [id number]  Date of birth: May 10, 1985  SUBJECTIVE:  Including CC & ROS.  Chief Complaint  Patient presents with  . Knee Injury    right knee    Christopher Hayes is a 33 y.o. male that is presenting with acute right knee pain.  The pain occurred about 2 weeks ago when he is working on the yard.  He had an inversion of the left ankle and felt a pop in turn of his right knee.  Since that time he has had ongoing pain and limited range of motion.  He has tried compression which is helped minimally.  No improvement with ibuprofen.  No history of similar symptoms or injury.  No history of surgery.  Symptoms are localized to the knee.  Does have swelling.  Does have mechanical symptoms with his knee locking from time to time..  Independent review of the right knee x-ray from 12/12 shows no acute abnormality.   Review of Systems  Constitutional: Negative for fever.  HENT: Negative for congestion.   Respiratory: Negative for cough.   Cardiovascular: Negative for chest pain.  Gastrointestinal: Negative for abdominal pain.  Musculoskeletal: Positive for joint swelling.  Skin: Negative for color change.  Neurological: Negative for weakness.  Hematological: Negative for adenopathy.    HISTORY: Past Medical, Surgical, Social, and Family History Reviewed & Updated per EMR.   Pertinent Historical Findings include:  Past Medical History:  Diagnosis Date  . Depression     Past Surgical History:  Procedure Laterality Date  . MANDIBLE SURGERY  2005    No Known Allergies  No family history on file.   Social History   Socioeconomic History  . Marital status: Married    Spouse name: Not on file  . Number of children: Not on file  . Years of education: Not on file  . Highest education level: Not on file  Occupational History  . Not on file  Tobacco Use  . Smoking status: Current Every Day Smoker    Years: 5.00    Types: Cigarettes  . Smokeless  tobacco: Never Used  Substance and Sexual Activity  . Alcohol use: No  . Drug use: No  . Sexual activity: Not on file  Other Topics Concern  . Not on file  Social History Narrative  . Not on file   Social Determinants of Health   Financial Resource Strain:   . Difficulty of Paying Living Expenses: Not on file  Food Insecurity:   . Worried About Charity fundraiser in the Last Year: Not on file  . Ran Out of Food in the Last Year: Not on file  Transportation Needs:   . Lack of Transportation (Medical): Not on file  . Lack of Transportation (Non-Medical): Not on file  Physical Activity:   . Days of Exercise per Week: Not on file  . Minutes of Exercise per Session: Not on file  Stress:   . Feeling of Stress : Not on file  Social Connections:   . Frequency of Communication with Friends and Family: Not on file  . Frequency of Social Gatherings with Friends and Family: Not on file  . Attends Religious Services: Not on file  . Active Member of Clubs or Organizations: Not on file  . Attends Archivist Meetings: Not on file  . Marital Status: Not on file  Intimate Partner Violence:   . Fear of Current or Ex-Partner: Not on file  . Emotionally  Abused: Not on file  . Physically Abused: Not on file  . Sexually Abused: Not on file     PHYSICAL EXAM:  VS: Ht 5\' 9"  (1.753 m)   Wt 270 lb (122.5 kg)   BMI 39.87 kg/m  Physical Exam Gen: NAD, alert, cooperative with exam, well-appearing ENT: normal lips, normal nasal mucosa,  Eye: normal EOM, normal conjunctiva and lids CV:  no edema, +2 pedal pulses   Resp: no accessory muscle use, non-labored,   Skin: no rashes, no areas of induration  Neuro: normal tone, normal sensation to touch Psych:  normal insight, alert and oriented MSK:  Right knee: Effusion. Tenderness to palpation of the medial joint line. Limited flexion and extension compared to contralateral side. No instability with valgus or varus stress  testing. Positive Thessaly test. Positive McMurray's test. Negative Lachman test. Neurovascularly intact  Limited ultrasound: Right knee:  Moderate to severe effusion in the suprapatellar pouch. Normal-appearing quadricep and patellar tendon. Lateral meniscus appears to be normal. There appears to be a horizontal tear in the medial meniscus. MCL and LCL appear to be normal  Summary: Findings suggest a medial meniscus tear  Ultrasound and interpretation by , MD    ASSESSMENT & PLAN:   Acute medial meniscus tear of right knee Recently had an injury with resulting pain and significant effusion on ultrasound.  Having some mechanical symptoms with suggestions of meniscal tear. -Hinged knee brace. -Counseled on home exercise therapy and supportive care. -MRI to evaluate for meniscus tear

## 2019-04-21 NOTE — Assessment & Plan Note (Signed)
Recently had an injury with resulting pain and significant effusion on ultrasound.  Having some mechanical symptoms with suggestions of meniscal tear. -Hinged knee brace. -Counseled on home exercise therapy and supportive care. -MRI to evaluate for meniscus tear

## 2019-04-27 ENCOUNTER — Ambulatory Visit: Payer: 59 | Admitting: Family Medicine

## 2019-04-28 ENCOUNTER — Encounter: Payer: Self-pay | Admitting: Family Medicine

## 2019-05-20 ENCOUNTER — Other Ambulatory Visit: Payer: Self-pay

## 2019-05-20 ENCOUNTER — Ambulatory Visit
Admission: RE | Admit: 2019-05-20 | Discharge: 2019-05-20 | Disposition: A | Discharge status: Home / Self Care | Payer: 59 | Source: Ambulatory Visit | Attending: Family Medicine | Admitting: Family Medicine

## 2019-05-20 DIAGNOSIS — S83241A Other tear of medial meniscus, current injury, right knee, initial encounter: Secondary | ICD-10-CM

## 2019-05-21 ENCOUNTER — Telehealth (INDEPENDENT_AMBULATORY_CARE_PROVIDER_SITE_OTHER): Payer: 59 | Admitting: Family Medicine

## 2019-05-21 DIAGNOSIS — X58XXXD Exposure to other specified factors, subsequent encounter: Secondary | ICD-10-CM | POA: Diagnosis not present

## 2019-05-21 DIAGNOSIS — S83004D Unspecified dislocation of right patella, subsequent encounter: Secondary | ICD-10-CM

## 2019-05-21 NOTE — Progress Notes (Signed)
Virtual Visit via Video Note  I connected with Christopher Hayes on 05/21/19 at  3:10 PM EST by a video enabled telemedicine application and verified that I am speaking with the correct person using two identifiers.   I discussed the limitations of evaluation and management by telemedicine and the availability of in person appointments. The patient expressed understanding and agreed to proceed.  History of Present Illness:  Christopher Hayes is a 34 year old male that is following up for his right knee pain.  MRI was revealing for findings consistent with patellar dislocation.  Showing bony contusions and a small possible chondral flap tear.  Cruciate and collateral ligaments were intact with no meniscal tears.  There was a small effusion.  He feels improvement of his symptoms overall and has been more active.  He has been doing the home exercises.   Observations/Objective:  Gen: NAD, alert, cooperative with exam, well-appearing  Assessment and Plan:  Patellar dislocation MRI was revealing for findings suggestive of patellar dislocation.  Has a possible small chondral defect but meniscus and collateral and cruciate ligaments intact. -Counseled on home exercise therapy and supportive care. - consider injection or PT.  - could consider tru-pull brace  Follow Up Instructions:    I discussed the assessment and treatment plan with the patient. The patient was provided an opportunity to ask questions and all were answered. The patient agreed with the plan and demonstrated an understanding of the instructions.   The patient was advised to call back or seek an in-person evaluation if the symptoms worsen or if the condition fails to improve as anticipated.   Clare Gandy, MD

## 2019-05-21 NOTE — Assessment & Plan Note (Signed)
MRI was revealing for findings suggestive of patellar dislocation.  Has a possible small chondral defect but meniscus and collateral and cruciate ligaments intact. -Counseled on home exercise therapy and supportive care. - consider injection or PT.  - could consider tru-pull brace

## 2021-08-13 ENCOUNTER — Emergency Department (HOSPITAL_BASED_OUTPATIENT_CLINIC_OR_DEPARTMENT_OTHER)
Admission: EM | Admit: 2021-08-13 | Discharge: 2021-08-13 | Disposition: A | Discharge status: Home / Self Care | Payer: 59 | Attending: Emergency Medicine | Admitting: Emergency Medicine

## 2021-08-13 ENCOUNTER — Emergency Department (HOSPITAL_BASED_OUTPATIENT_CLINIC_OR_DEPARTMENT_OTHER): Payer: 59

## 2021-08-13 ENCOUNTER — Encounter (HOSPITAL_BASED_OUTPATIENT_CLINIC_OR_DEPARTMENT_OTHER): Payer: Self-pay | Admitting: Emergency Medicine

## 2021-08-13 ENCOUNTER — Other Ambulatory Visit: Payer: Self-pay

## 2021-08-13 DIAGNOSIS — S90921A Unspecified superficial injury of right foot, initial encounter: Secondary | ICD-10-CM | POA: Insufficient documentation

## 2021-08-13 DIAGNOSIS — X58XXXA Exposure to other specified factors, initial encounter: Secondary | ICD-10-CM | POA: Diagnosis not present

## 2021-08-13 DIAGNOSIS — Y92007 Garden or yard of unspecified non-institutional (private) residence as the place of occurrence of the external cause: Secondary | ICD-10-CM | POA: Diagnosis not present

## 2021-08-13 DIAGNOSIS — Y9373 Activity, racquet and hand sports: Secondary | ICD-10-CM | POA: Diagnosis not present

## 2021-08-13 DIAGNOSIS — S99921A Unspecified injury of right foot, initial encounter: Secondary | ICD-10-CM

## 2021-08-13 NOTE — ED Triage Notes (Signed)
Pt reports his R lateral foot began to hurt after playing [place] yesterday. No known injury he can specifically remember. No obvious deformity ?

## 2021-08-13 NOTE — Discharge Instructions (Signed)
Your x-ray today was negative for fracture or dislocation.  I suspect that you may have suffered from a small stress fracture in that area which can often not be seen on xray immediately after an injury. ? ?Fortunately the treatment of this is pretty conservative. I have given you a post op shoe which you can wear for comfort walking around, and feel free to use your crutches at home as needed. Apply a compressive ACE bandage. Rest and elevate the affected painful area.  Apply cold compresses intermittently as needed.  As pain recedes, begin normal activities slowly as tolerated. You should feel better in a week or two ?

## 2021-08-13 NOTE — ED Provider Notes (Signed)
?[facility] EMERGENCY DEPARTMENT ?Provider Note ? ? ?CSN: [id number] ?Arrival date & time: 08/13/21  1006 ? ?  ? ?History ? ?Chief Complaint  ?Patient presents with  ? Foot Injury  ? ? ?Christopher Hayes is a 36 y.o. male who presents to the ED for evaluation of right foot pain that occurred after playing badminton in the yard with his kids yesterday.  There is no significant event or injury that triggered his pain, however he noticed swelling and pain to the lateral aspect of his right foot shortly after playing.  Throughout the day and last night, pain has become progressively worse and is having difficulty walking, although he is ambulatory with pain.  He has been using a pair of crutches that he had at home for relief and has been keeping the foot elevated.  No other treatment prior to arrival.  He denies numbness, tingling.  He has no other systemic complaints. ? ? ?Foot Injury ? ?  ? ?Home Medications ?Prior to Admission medications   ?Medication Sig Start Date End Date Taking? Authorizing Provider  ?buPROPion (WELLBUTRIN) 75 MG tablet Take 75 mg by mouth 2 (two) times daily.   Yes [provider]  ?cetirizine (ZYRTEC) 5 MG tablet Take 5 mg by mouth daily.   Yes [provider]  ?doxycycline (ORACEA) 40 MG capsule Take 40 mg by mouth every morning.   Yes [provider]  ?silver sulfADIAZINE (SILVADENE) 1 % cream Apply 1 application. topically daily.   Yes [provider]  ?testosterone enanthate (DELATESTRYL) 200 MG/ML injection Inject into the muscle once a week. For IM use only   Yes [provider]  ?   ? ?Allergies    ?Patient has no known allergies.   ? ?Review of Systems   ?Review of Systems ? ?Physical Exam ?Updated Vital Signs ?BP (!) 132/97 (BP Location: Left Arm)   Pulse 94   Temp 98.4 ?F (36.9 ?C) (Oral)   Resp 20   SpO2 98%  ?Physical Exam ?Vitals and nursing note reviewed.  ?Constitutional:   ?   General: He is not in acute distress. ?    Appearance: He is not ill-appearing.  ?HENT:  ?   Head: Atraumatic.  ?Eyes:  ?   Conjunctiva/sclera: Conjunctivae normal.  ?Cardiovascular:  ?   Rate and Rhythm: Normal rate and regular rhythm.  ?   Pulses: Normal pulses.     ?     Posterior tibial pulses are 2+ on the right side and 2+ on the left side.  ?   Heart sounds: No murmur heard. ?Pulmonary:  ?   Effort: Pulmonary effort is normal. No respiratory distress.  ?   Breath sounds: Normal breath sounds.  ?Abdominal:  ?   General: Abdomen is flat. There is no distension.  ?   Palpations: Abdomen is soft.  ?   Tenderness: There is no abdominal tenderness.  ?Musculoskeletal:     ?   General: Normal range of motion.  ?   Cervical back: Normal range of motion.  ?     Feet: ? ?Feet:  ?   Comments: Mild swelling on lateral foot as noted above.  No bruising, palpable deformities.  No erythema, fluctuance or induration.  Motor function intact. ?Skin: ?   General: Skin is warm and dry.  ?   Capillary Refill: Capillary refill takes less than 2 seconds.  ?Neurological:  ?   General: No focal deficit present.  ?   Mental  Status: He is alert.  ?Psychiatric:     ?   Mood and Affect: Mood normal.  ? ? ?ED Results / Procedures / Treatments   ?Labs ?(all labs ordered are listed, but only abnormal results are displayed) ?Labs Reviewed - No data to display ? ?EKG ?None ? ?Radiology ?DG Foot Complete Right ? ?Result Date: 08/13/2021 ?CLINICAL DATA:  Right foot began hurting after playing badminton yesterday. EXAM: RIGHT FOOT COMPLETE - 3+ VIEW COMPARISON:  None. FINDINGS: No fracture or bone lesion. Joints normally spaced and aligned. Small dorsal and plantar calcaneal spurs. Mild lateral soft tissue swelling suggested. IMPRESSION: No fracture or dislocation. Electronically Signed   By: Amie Portland M.D.   On: 08/13/2021 10:57   ? ?Procedures ?Procedures  ? ? ?Medications Ordered in ED ?Medications - No data to display ? ?ED Course/ Medical Decision Making/ A&P ?  ?                         ?Medical Decision Making ?Amount and/or Complexity of Data Reviewed ?Radiology: ordered. ? ? ?36 year old male in no acute distress, nontoxic-appearing presents to the ED for evaluation of a right foot injury that occurred while playing badminton yesterday.  Physical exam significant for swelling of the lateral right foot with moderate tenderness to palpation.  No bruising, ecchymosis or signs of infection.  I ordered and interpreted an x-ray of the right foot which shows some mild soft tissue swelling without fracture or dislocation.  I agree with radiologist interpretation.  Given patient's symptoms and history, I do suspect that he suffered from a small stress fracture of his lateral foot, which can be missed on x-ray after initial injury.  We will provide postop shoe for comfort.  Conservative therapy indicated.  RICE protocol indicated.  Discharged home in good condition. ? ?Final Clinical Impression(s) / ED Diagnoses ?Final diagnoses:  ?Injury of right foot, initial encounter  ? ? ?Rx / DC Orders ?ED Discharge Orders   ? ? None  ? ?  ? ? ?  ?Janell Quiet, [place] ?08/13/21 1112 ? ?  ?Melene Plan, DO ?08/13/21 1200 ? ?

## 2022-08-13 ENCOUNTER — Encounter: Payer: Self-pay | Admitting: *Deleted

## 2023-11-04 IMAGING — DX DG FOOT COMPLETE 3+V*R*
3 series · 3 of 3 positions shown · non-contrast
Comparison: None.

CLINICAL DATA: Right foot began hurting after playing badminton
yesterday.

EXAM:
RIGHT FOOT COMPLETE - 3+ VIEW

[foot ap]
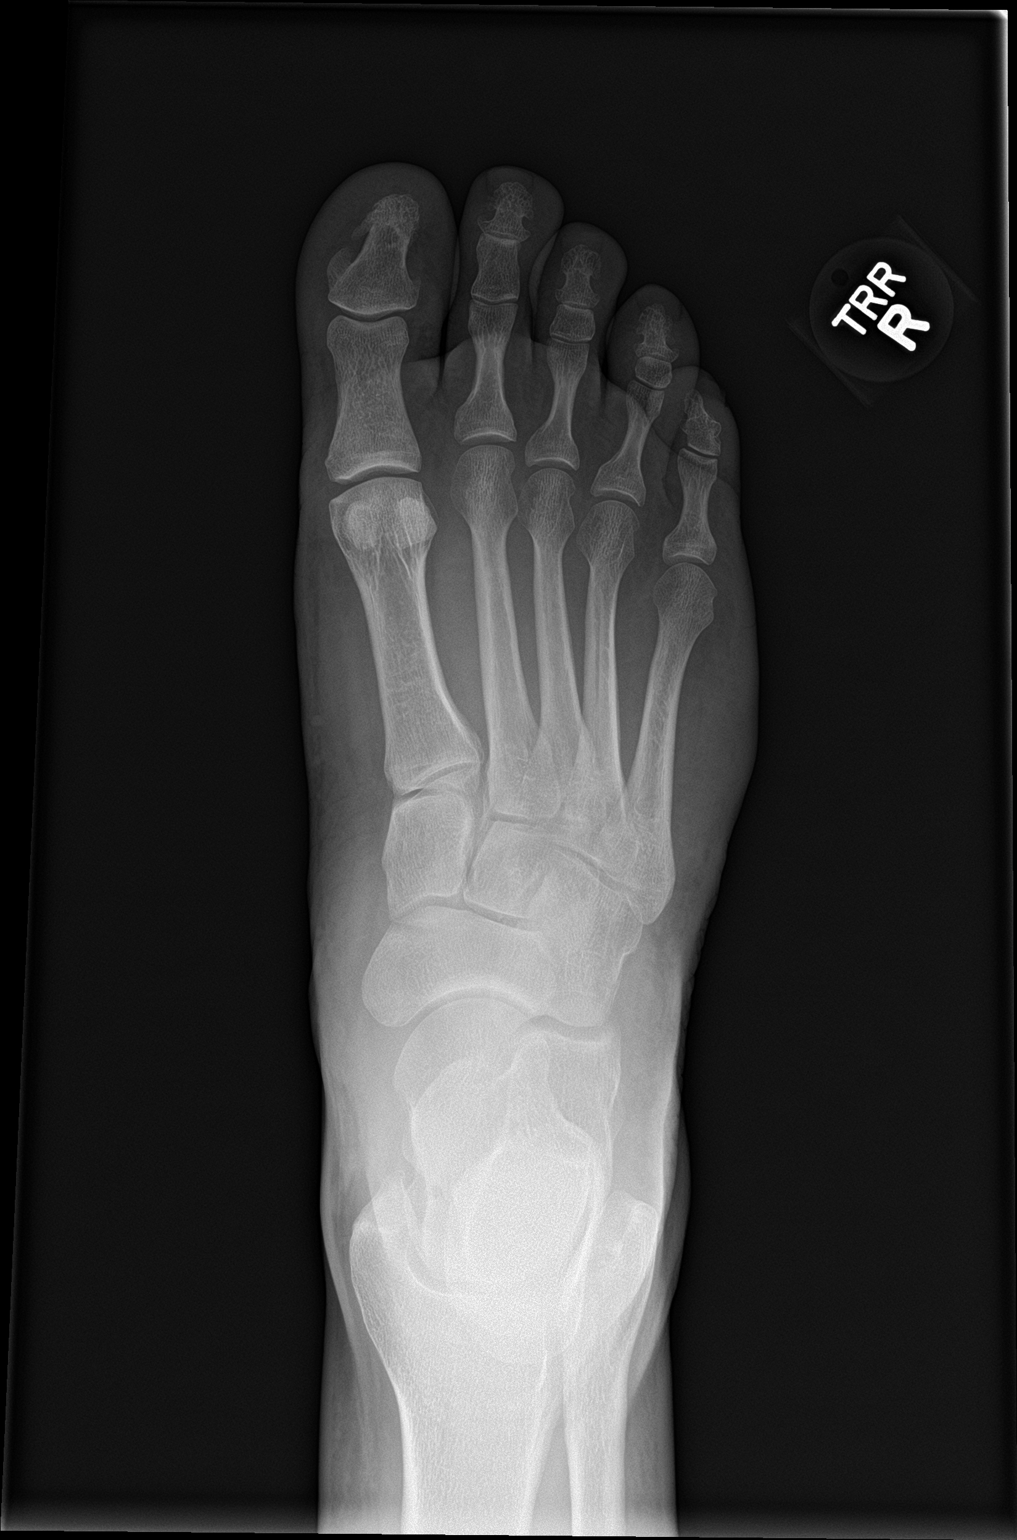

[foot obl]
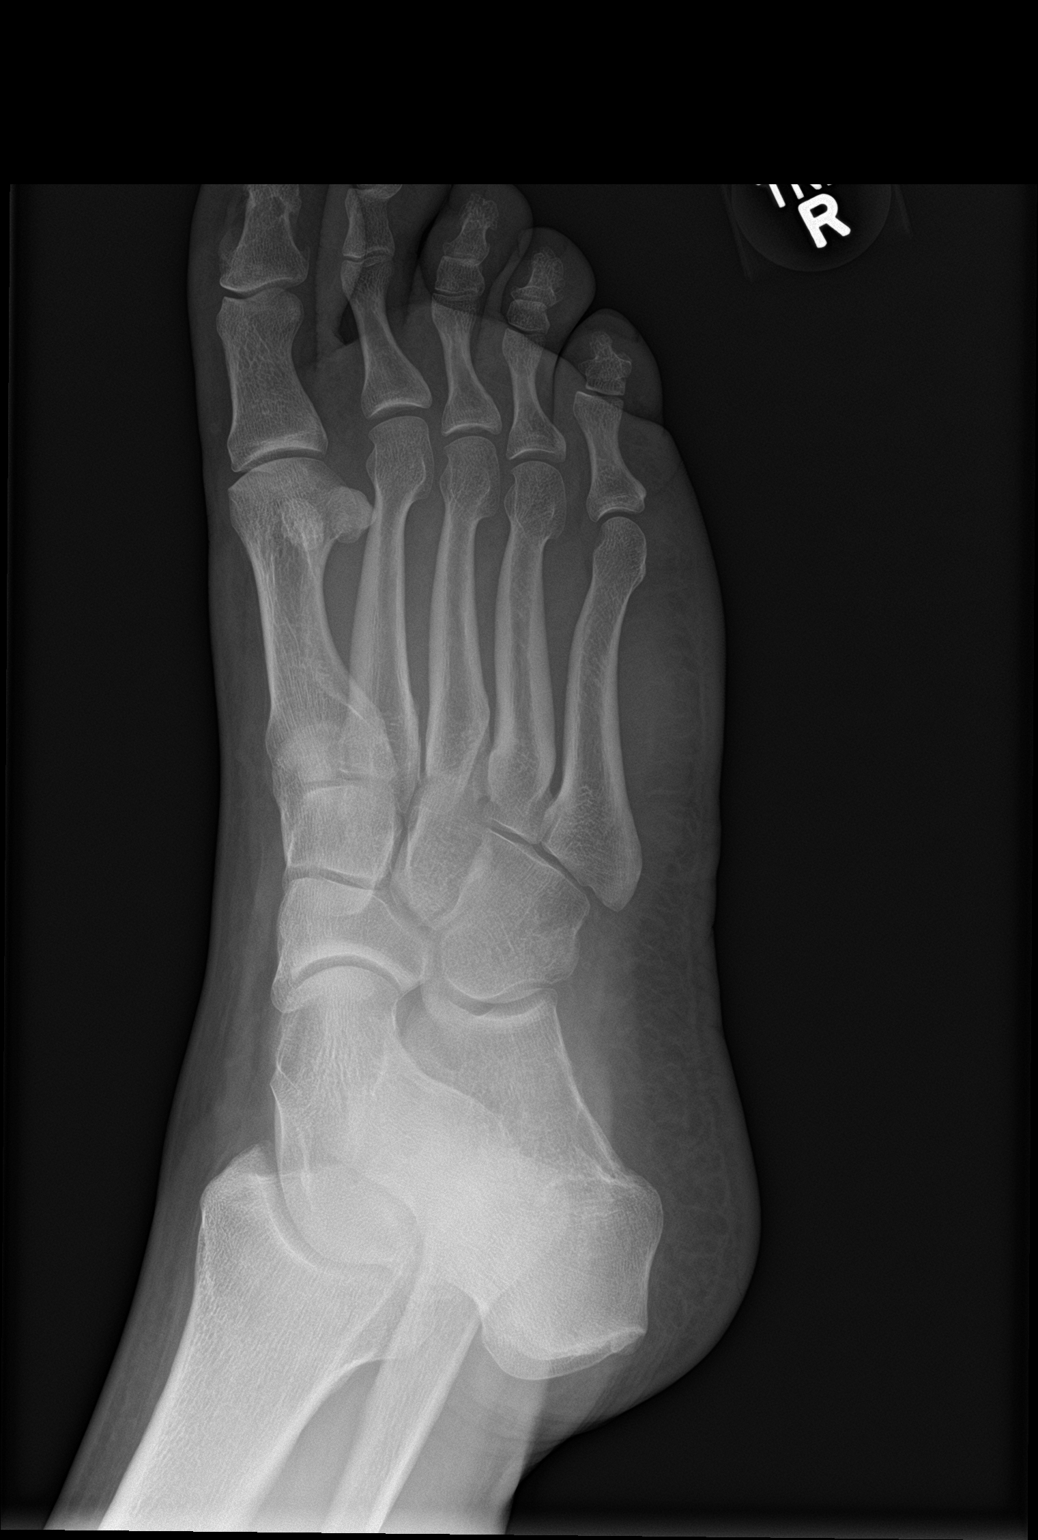

[foot lat]
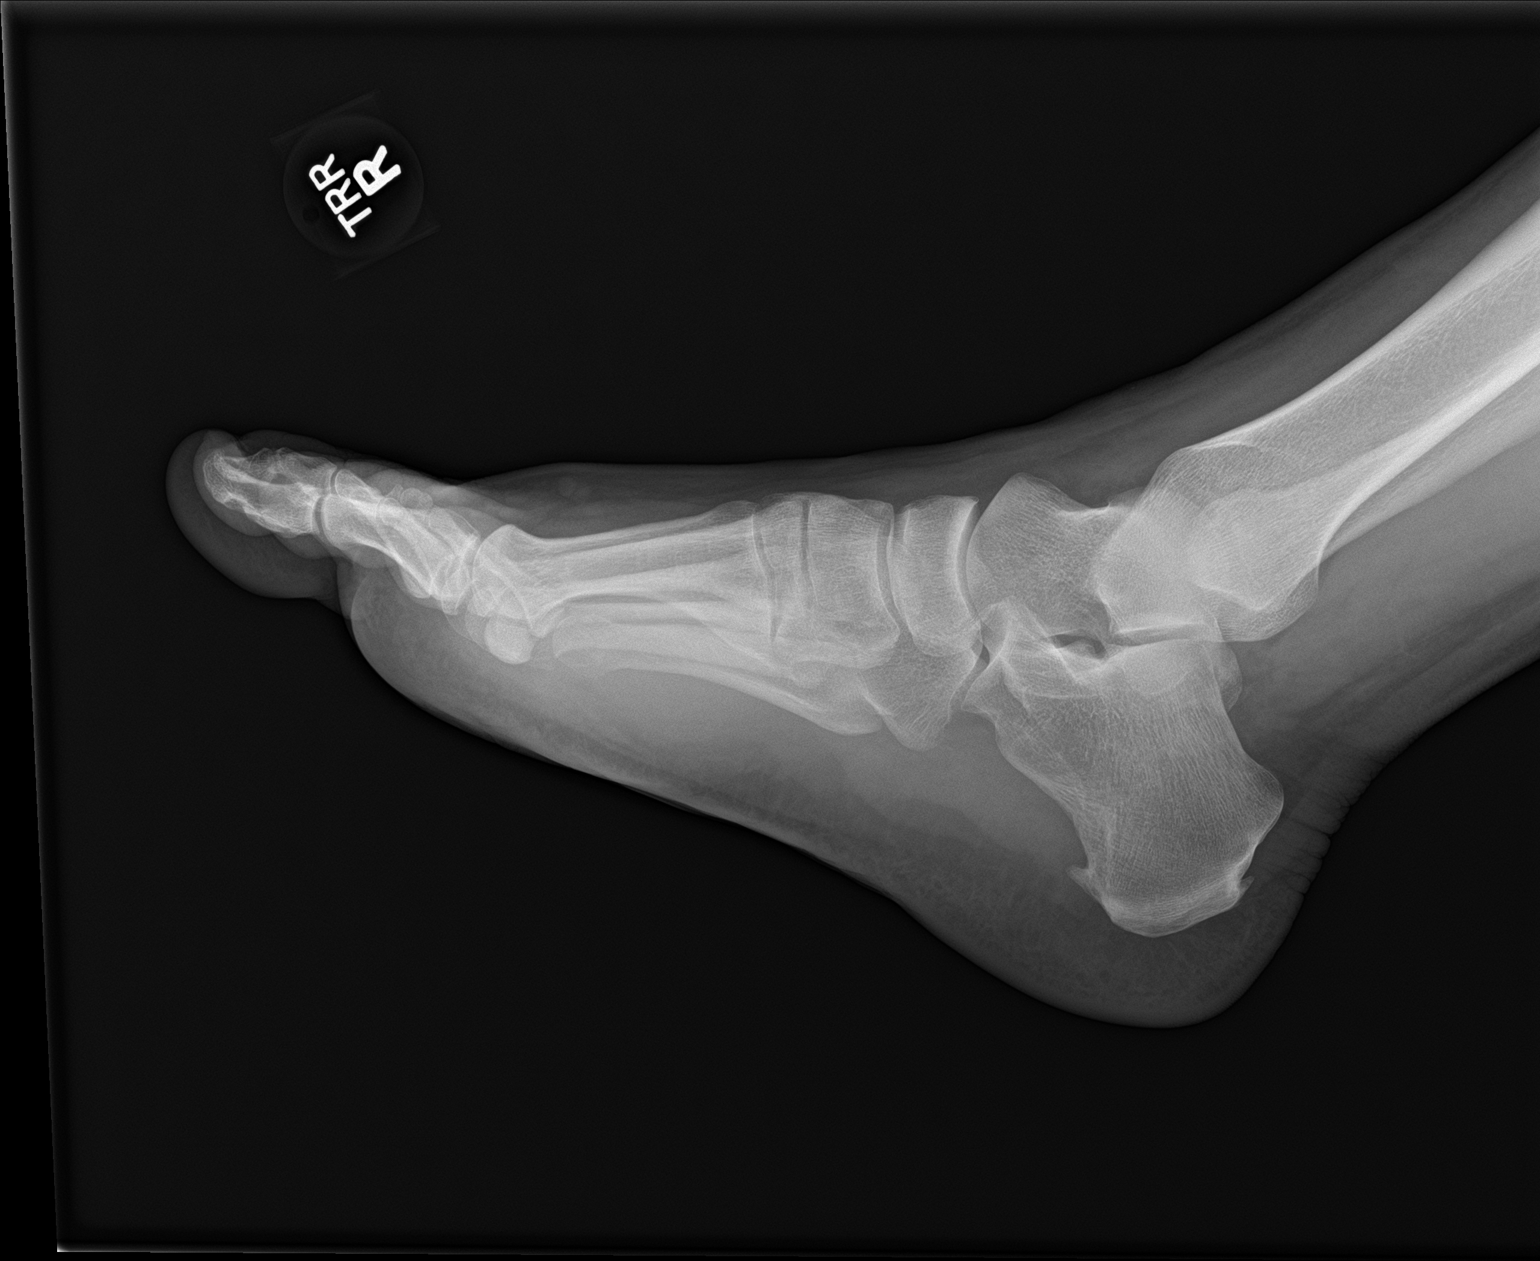

[3 of 3 positions shown; findings below may reference images not displayed]

FINDINGS: No fracture or bone lesion.

Joints normally spaced and aligned.

Small dorsal and plantar calcaneal spurs.

Mild lateral soft tissue swelling suggested.
IMPRESSION: No fracture or dislocation.

## 2024-01-14 ENCOUNTER — Other Ambulatory Visit: Payer: Self-pay

## 2024-01-14 ENCOUNTER — Encounter (HOSPITAL_BASED_OUTPATIENT_CLINIC_OR_DEPARTMENT_OTHER): Payer: Self-pay | Admitting: Emergency Medicine

## 2024-01-14 ENCOUNTER — Emergency Department (HOSPITAL_BASED_OUTPATIENT_CLINIC_OR_DEPARTMENT_OTHER)
Admission: EM | Admit: 2024-01-14 | Discharge: 2024-01-14 | Disposition: A | Discharge status: Home / Self Care | Attending: Emergency Medicine | Admitting: Emergency Medicine

## 2024-01-14 DIAGNOSIS — R220 Localized swelling, mass and lump, head: Secondary | ICD-10-CM | POA: Diagnosis present

## 2024-01-14 DIAGNOSIS — L0100 Impetigo, unspecified: Secondary | ICD-10-CM | POA: Insufficient documentation

## 2024-01-14 HISTORY — DX: Hidradenitis suppurativa: L73.2

## 2024-01-14 MED ORDER — MUPIROCIN CALCIUM 2 % EX CREA: 1.0000 | TOPICAL_CREAM | Freq: Two times a day (BID) | CUTANEOUS | 0 refills | Status: AC

## 2024-01-14 NOTE — ED Provider Notes (Signed)
 [facility] Provider Note   CSN: [id number] Arrival date & time: 01/14/24  1447     Patient presents with: Facial Swelling   Christopher Hayes is a 38 y.o. male.   Patient to ED for evaluation of worsening skin condition. He has a history of HS, followed by dermatology. He reports complications including cutaneous fungal infections affecting his face within his beard and diagnosed with a secondary bacterial infection 4 days ago, prescribed Doxycyline. He is here to day because the rash similar to what has been in the beard is now spreading to other areas of the face. No fever.   The history is provided by the patient. No language interpreter was used.       Prior to Admission medications   Medication Sig Start Date End Date Taking? Authorizing Provider  mupirocin  cream (BACTROBAN ) 2 % Apply 1 Application topically 2 (two) times daily. 01/14/24  Yes Amritpal Shropshire, Margit, PA-C  buPROPion (WELLBUTRIN) 75 MG tablet Take 75 mg by mouth 2 (two) times daily.    [provider]  cetirizine (ZYRTEC) 5 MG tablet Take 5 mg by mouth daily.    [provider]  doxycycline (ORACEA) 40 MG capsule Take 40 mg by mouth every morning.    [provider]  silver sulfADIAZINE (SILVADENE) 1 % cream Apply 1 application. topically daily.    [provider]  testosterone enanthate (DELATESTRYL) 200 MG/ML injection Inject into the muscle once a week. For IM use only    [provider]    Allergies: Patient has no known allergies.    Review of Systems  Updated Vital Signs BP (!) 148/98 (BP Location: Left Arm)   Pulse 99   Temp 98.6 F (37 C) (Oral)   Resp 20   Ht 5' 9 (1.753 m)   Wt 104.3 kg   SpO2 94%   BMI 33.97 kg/m   Physical Exam Vitals and nursing note reviewed.  Constitutional:      Appearance: Normal appearance.  Skin:    Comments: Facial rash that consists of raised, dry areas over forehead and  through the eyebrows. Within the beard are areas of rash with a wet appearing, yellow cover. No active drainage. No cutaneous swelling. There is a nodule at the right perioral area that is palpable, firm, nonfluctuant. This palpable on the buccal surface as well.   Neurological:     Mental Status: He is alert.     (all labs ordered are listed, but only abnormal results are displayed) Labs Reviewed - No data to display  EKG: None  Radiology: No results found.   Procedures   Medications Ordered in the ED - No data to display  Clinical Course as of 01/14/24 1616  Tue Jan 14, 2024  1607 87 male presents ED with complaint for facial rash.  Patient reports has a history of hidradenitis.  He is on chronic immunosuppressive medication for that through his dermatologist.  He sees dermatology at [facility] and has an appointment in 3 days with him.  He reports he was recently started on a course of doxycycline for an outbreak around his face and beard.  He says since he is on his developed a crusty, honey colored rash that spread over his face.  He was also put on antifungal shampoo and cream which he has been using.  He is concerned because he has been on doxycycline for 4 days now with no improvement of his rash.  Denies fevers or chills.  On exam he has got follicular lesions with honey crusted discoloration and oozing around the face and under his beard line.  No involvement of the soft tissue mucosa.  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  Will start him on mupirocin  ointment.  I would avoid any type of steroid medication given concern for fungal infection by his dermatologist.  I think he can follow-up on Friday, although I requested that he call their office tomorrow to see if they can accommodate him sooner.  We do not have dermatology on staff at [facility] for an urgent outpatient referral.  [MT]  1608 He does have a small infected hair follicle to the right side of his mouth that is  draining and superficial.  No indication for I&D at this time [MT]  1613 Patient will be started on Mupiricin as advised by Dr. Cottie. [SU]    Clinical Course User Index [MT] Trifan, Donnice PARAS, MD [SU] Odell Balls, PA-C                                 Medical Decision Making       Final diagnoses:  Impetigo    ED Discharge Orders          Ordered    mupirocin  cream (BACTROBAN ) 2 %  2 times daily        01/14/24 1615               Odell Balls, PA-C 01/14/24 1616    Cottie Donnice PARAS, MD 01/14/24 1651

## 2024-01-14 NOTE — Discharge Instructions (Signed)
 As per Dr. Cottie, use the topical antibiotic, Bactroban , as directed. Keep the scheduled appointment with dermatology this week.

## 2024-01-14 NOTE — ED Triage Notes (Signed)
 Pt reports possible skin infection since last week, saw dermatologist Friday, dx impetigo, took prescriptions, sx ongoing and worsening. Reports new R facial swelling this AM. Denies difficulty chewing/swallowing.    Hx of HS.
